# Patient Record
Sex: Female | Born: 1975 | Race: White | Hispanic: No | Marital: Single | Smoking: Never smoker
Health system: Southern US, Community
[De-identification: ages and names within clinical notes are randomized; demographics above are authoritative.]

---

## 2014-10-30 DIAGNOSIS — L259 Unspecified contact dermatitis, unspecified cause: Secondary | ICD-10-CM | POA: Insufficient documentation

## 2019-08-25 DIAGNOSIS — K824 Cholesterolosis of gallbladder: Secondary | ICD-10-CM | POA: Insufficient documentation

## 2019-10-06 ENCOUNTER — Other Ambulatory Visit: Payer: Self-pay

## 2019-10-06 ENCOUNTER — Encounter: Payer: Self-pay | Admitting: Podiatry

## 2019-10-06 ENCOUNTER — Ambulatory Visit: Payer: BC Managed Care – PPO | Admitting: Podiatry

## 2019-10-06 ENCOUNTER — Ambulatory Visit (INDEPENDENT_AMBULATORY_CARE_PROVIDER_SITE_OTHER): Payer: BC Managed Care – PPO

## 2019-10-06 VITALS — BP 140/105 | HR 103 | Resp 16

## 2019-10-06 DIAGNOSIS — S92901A Unspecified fracture of right foot, initial encounter for closed fracture: Secondary | ICD-10-CM

## 2019-10-06 DIAGNOSIS — R87619 Unspecified abnormal cytological findings in specimens from cervix uteri: Secondary | ICD-10-CM | POA: Insufficient documentation

## 2019-10-06 DIAGNOSIS — N761 Subacute and chronic vaginitis: Secondary | ICD-10-CM | POA: Insufficient documentation

## 2019-10-06 DIAGNOSIS — N644 Mastodynia: Secondary | ICD-10-CM | POA: Insufficient documentation

## 2019-10-07 ENCOUNTER — Telehealth: Payer: Self-pay | Admitting: *Deleted

## 2019-10-07 DIAGNOSIS — S92901A Unspecified fracture of right foot, initial encounter for closed fracture: Secondary | ICD-10-CM

## 2019-10-07 NOTE — Telephone Encounter (Signed)
-----   Message from Edrick Kins, DPM sent at 10/06/2019  2:19 PM EST ----- Regarding: MRI RT heel Please order MRI RT heel w/out contrast.   Dx: fracture RT heel. Possible shepard's fracture vs. Os trigonum RT talus  Thanks, Dr. Amalia Hailey

## 2019-10-07 NOTE — Telephone Encounter (Signed)
Orders to Dr. Amalia Hailey assistant, faxed to [facility].

## 2019-10-09 NOTE — Progress Notes (Signed)
   HPI: 43 y.o. female presenting today as a new patient with a chief complaint of aching pain to the right lateral and posterior heel that began about 9 weeks ago. She reports associated numbness in the 3rd and 4th toes as well. Walking and wearing shoes increases the pain. She has received injections from Dr. Gershon Mussel, worn a CAM boot and has been taking Aleve daily. Patient is here for further evaluation and treatment.   No past medical history on file.   Physical Exam: General: The patient is alert and oriented x3 in no acute distress.  Dermatology: Skin is warm, dry and supple bilateral lower extremities. Negative for open lesions or macerations.  Vascular: Palpable pedal pulses bilaterally. No edema or erythema noted. Capillary refill within normal limits.  Neurological: Epicritic and protective threshold grossly intact bilaterally.   Musculoskeletal Exam: Range of motion within normal limits to all pedal and ankle joints bilateral. Muscle strength 5/5 in all groups bilateral.  Pain on palpation noted with lateral compression of the calcaneal tubercle.  There is also pain with range of motion in the ankle specifically with plantar flexion.  Radiographic Exam:  Possible hairline calcaneal fracture of the right calcaneal tubercle noted. Shepard's fracture vs os trigonum right noted.     Assessment: 1. Possible hairline calcaneal fracture right calcaneal tubercle  2. Shepard's fracture vs os trigonum right    Plan of Care:  1. Patient evaluated. X-Rays reviewed.  2. MRI of the right heel ordered.  3. Continue weightbearing in CAM boot.  4. Recommended OTC Motrin as needed.  5. Return to clinic in 2 weeks to review MRI results.       Edrick Kins, DPM [facility]  Dr. Edrick Kins, DPM    [address], [place] [address]                Office [phone]  Fax [phone]

## 2019-10-13 NOTE — Telephone Encounter (Signed)
Chelsea Silva, CMA started pre-cert for 52080, [employer] required clinicals for review prior to pre-cert, fax to [phone] Nurse Review with insurance information and date of birth. Orders faxed to [facility] Nurse Review.

## 2019-10-20 ENCOUNTER — Ambulatory Visit
Admission: RE | Admit: 2019-10-20 | Discharge: 2019-10-20 | Disposition: A | Discharge status: Home / Self Care | Payer: BC Managed Care – PPO | Source: Ambulatory Visit | Attending: Podiatry | Admitting: Podiatry

## 2019-10-20 ENCOUNTER — Other Ambulatory Visit: Payer: Self-pay

## 2019-10-20 DIAGNOSIS — S92901A Unspecified fracture of right foot, initial encounter for closed fracture: Secondary | ICD-10-CM

## 2019-10-22 ENCOUNTER — Ambulatory Visit: Payer: BC Managed Care – PPO | Admitting: Podiatry

## 2019-10-22 ENCOUNTER — Other Ambulatory Visit: Payer: Self-pay

## 2019-10-22 DIAGNOSIS — S92901A Unspecified fracture of right foot, initial encounter for closed fracture: Secondary | ICD-10-CM

## 2019-10-28 NOTE — Progress Notes (Signed)
   HPI: 43 y.o. female presenting today for follow-up evaluation regarding posterior heel and ankle pain.  Patient was a referral from Dr. Gershon Mussel.  Last visit on October 06, 2019 MRI was ordered.  Today the patient states that it feels better but still not right.  She has occasional swelling.  She has been wearing the cam boot and taking Aleve and icing the foot.  No past medical history on file.   Physical Exam: General: The patient is alert and oriented x3 in no acute distress.  Dermatology: Skin is warm, dry and supple bilateral lower extremities. Negative for open lesions or macerations.  Vascular: Palpable pedal pulses bilaterally. No edema or erythema noted. Capillary refill within normal limits.  Neurological: Epicritic and protective threshold grossly intact bilaterally.   Musculoskeletal Exam: Range of motion within normal limits to all pedal and ankle joints bilateral. Muscle strength 5/5 in all groups bilateral.  Pain on palpation noted with lateral compression of the calcaneal tubercle.  There is also pain with range of motion in the ankle specifically with plantar flexion.  MRI impression 10/20/2019: 1. Partially healed fracture or stress fracture involving the posterior third of the calcaneal body. 2. Intact medial and lateral ankle ligaments and tendons. 3. Os trigonum but no findings for inflammation/edema. 4. Moderate-sized joint effusion involving the posterior talocalcaneal facet.   Assessment: 1.  Stress fracture calcaneal body right 2.  os trigonum right-noncontributory to the patient's symptoms   Plan of Care:  1. Patient evaluated.  MRI reviewed 2.  Recommend conservative treatment at the moment. 3. Continue weightbearing in CAM boot.  4. Recommended OTC Motrin as needed.  5. Return to clinic in 6 weeks for follow-up x-ray      Edrick Kins, DPM [facility]  Dr. Edrick Kins, DPM    [address], [place] [address]                Office [phone]  Fax [phone]

## 2019-12-03 ENCOUNTER — Ambulatory Visit: Payer: BC Managed Care – PPO | Admitting: Podiatry

## 2021-11-06 IMAGING — MR MR ANKLE*R* W/O CM
4 of 5 series · 13 of 40 positions shown · non-contrast
Comparison: Radiographs 10/06/2019

CLINICAL DATA: Right heel pain for 3 months. Fell off of a crate 3
months ago.

EXAM:
MRI OF THE RIGHT ANKLE WITHOUT CONTRAST
TECHNIQUE: Multiplanar, multisequence MR imaging of the ankle was performed. No
intravenous contrast was administered.

[Series 3: T2 fat-sat · axial · 3.5mm · 0.22mm/px · z∈[-64,+21]mm · 3 of 30 slices shown (1 of 3)]
[im 4/30]
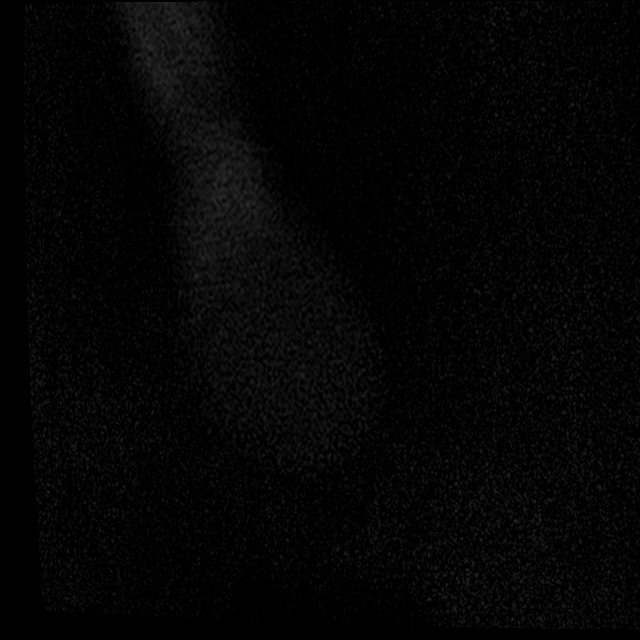
[im 17/30]
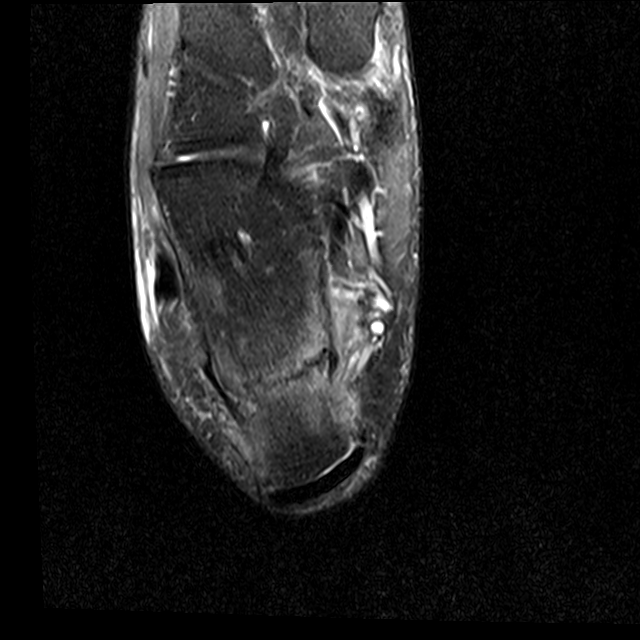
[im 26/30]
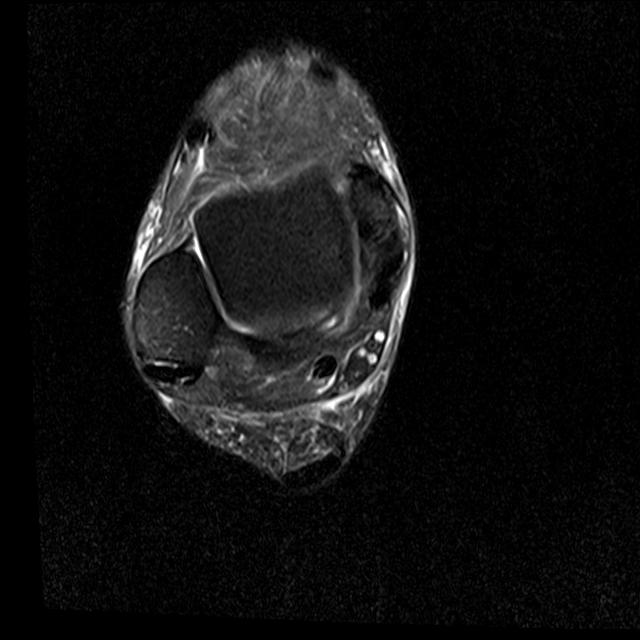

[Series 4: PD fat-sat · axial · 3.5mm · 0.22mm/px · z∈[-75,+21]mm · 4 of 30 slices shown]
[im 1/30]
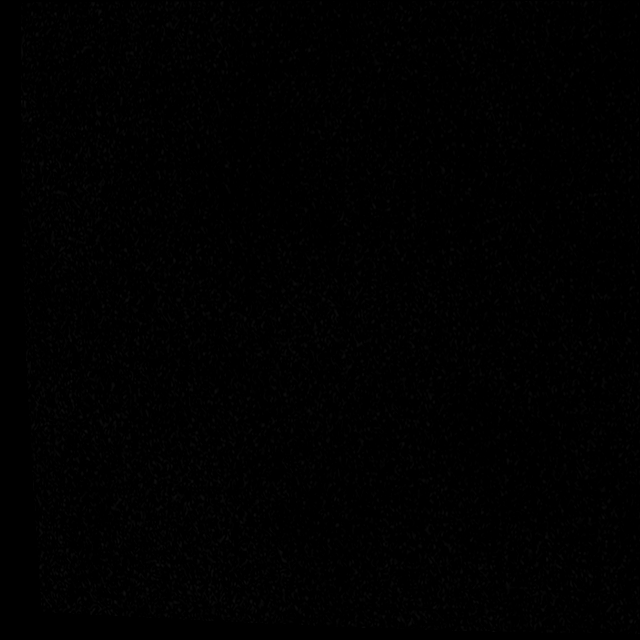
[im 4/30]
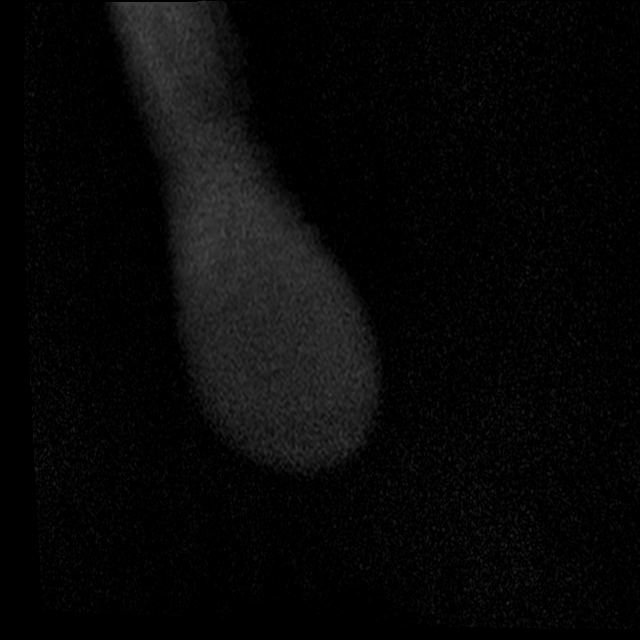
[im 15/30]
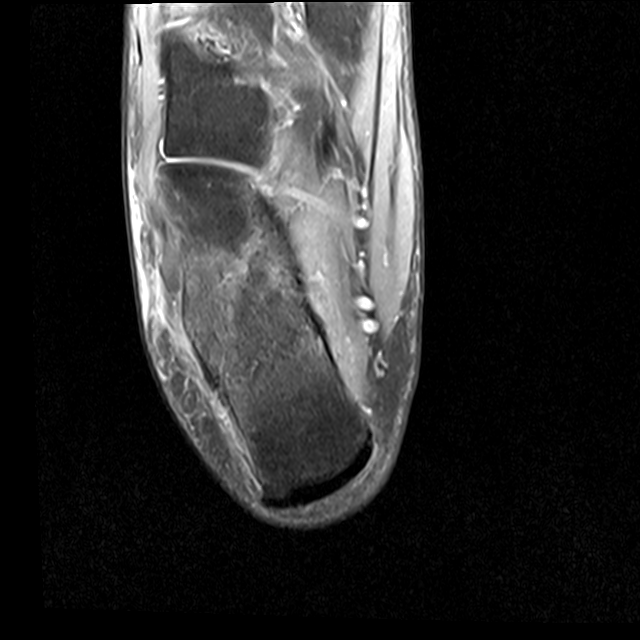
[im 26/30]
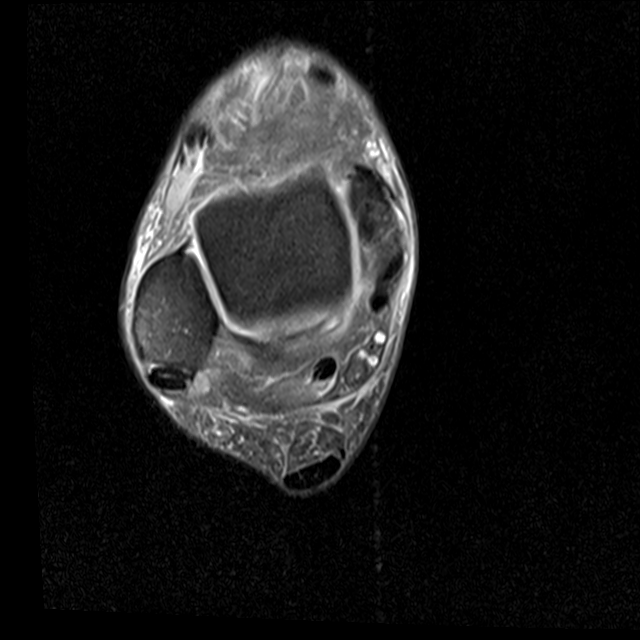

[Series 7: T2 fat-sat · sagittal · 3.5mm · 0.25mm/px · 3 of 21 slices shown (2 of 3)]
[im 5/21]
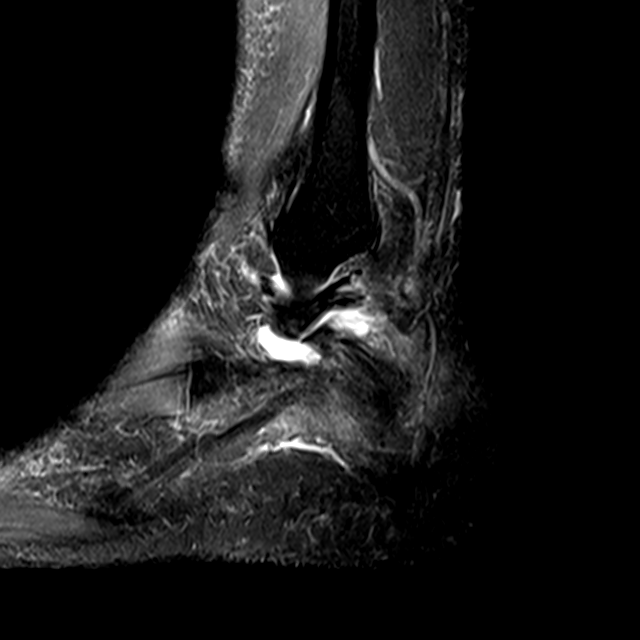
[im 13/21]
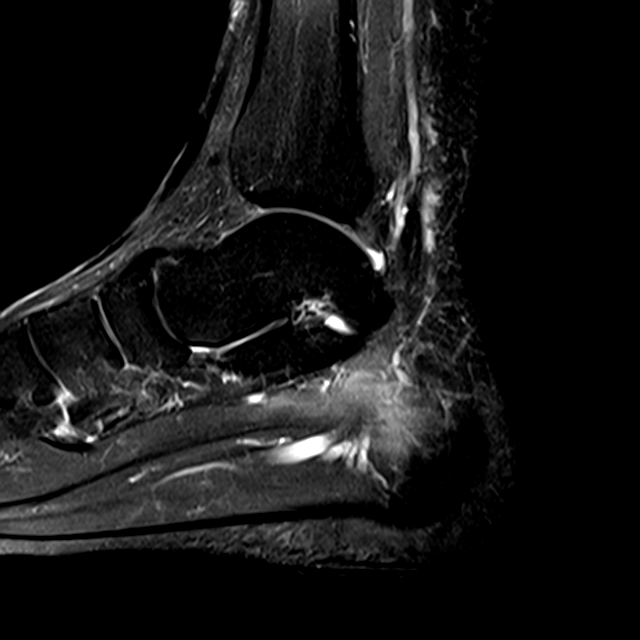
[im 21/21]
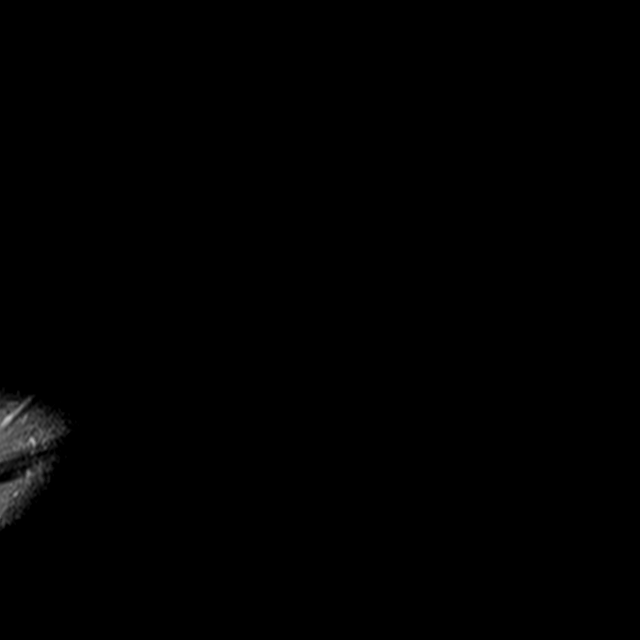

[Series 8: T2 fat-sat · coronal · 3.3mm · 0.23mm/px · 3 of 30 slices shown (3 of 3)]
[im 4/30]
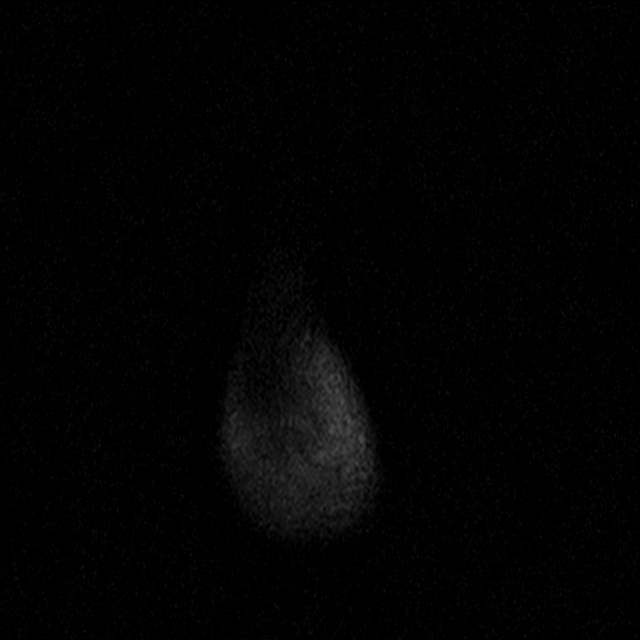
[im 15/30]
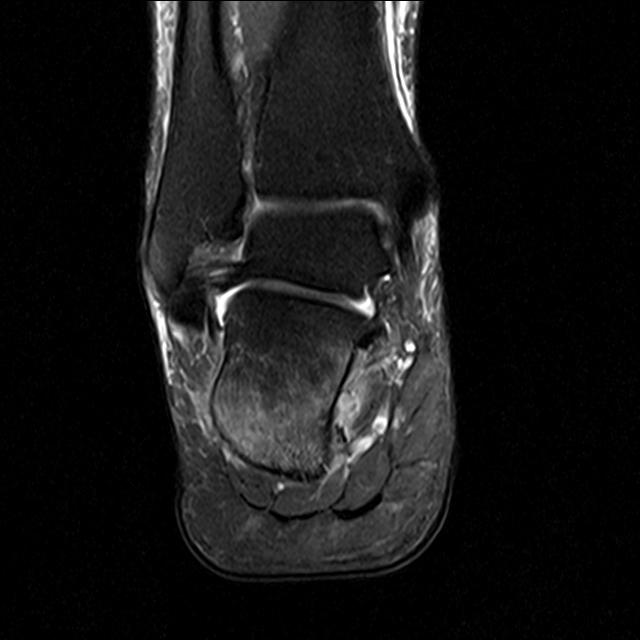
[im 26/30]
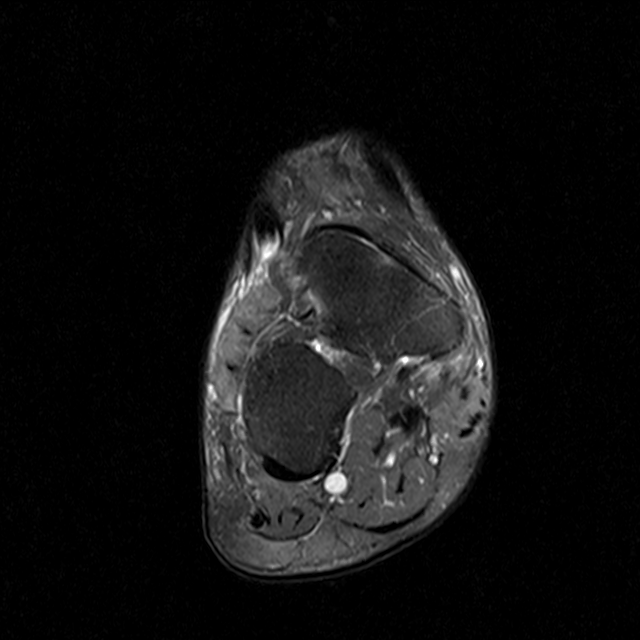

[13 of 40 positions shown; findings below may reference images not displayed]

FINDINGS: TENDONS

Peroneal: Intact

Posteromedial: Intact

Anterior: Intact

Achilles: Normal

Plantar Fascia: Intact

LIGAMENTS

Lateral: Intact

Medial: Intact

CARTILAGE

Ankle Joint: Normal articular cartilage. No cartilage defects or
osteochondral lesion. No joint effusion. Sizable os trigonum but no
edema or fracture.

Subtalar Joints/Sinus Tarsi: The subtalar joints are maintained.
There is a moderate amount of fluid in the posterior talocalcaneal
facet and a small amount of fluid in the sinus tarsi. The cervical
and interosseous ligaments are intact and the spring ligament is
intact.

Bones: There is a stress fracture or partially healed fracture
involving the posterior third of the calcaneal body. Moderate
surrounding marrow edema.

No other significant bony findings.

Other: Mild retrocalcaneal bursitis is noted.
IMPRESSION: 1. Partially healed fracture or stress fracture involving the
posterior third of the calcaneal body.
2. Intact medial and lateral ankle ligaments and tendons.
3. Os trigonum but no findings for inflammation/edema.
4. Moderate-sized joint effusion involving the posterior
talocalcaneal facet.
